# Patient Record
Sex: Female | Born: 1985 | Race: Black or African American | Hispanic: No | Marital: Single | Smoking: Never smoker
Health system: Southern US, Community
[De-identification: ages and names within clinical notes are randomized; demographics above are authoritative.]

---

## 2019-05-24 ENCOUNTER — Encounter: Payer: Self-pay | Admitting: Medical Oncology

## 2019-05-24 ENCOUNTER — Other Ambulatory Visit: Payer: Self-pay

## 2019-05-24 ENCOUNTER — Emergency Department
Admission: EM | Admit: 2019-05-24 | Discharge: 2019-05-24 | Disposition: A | Discharge status: Home / Self Care | Payer: BC Managed Care – PPO | Attending: Emergency Medicine | Admitting: Emergency Medicine

## 2019-05-24 DIAGNOSIS — N3000 Acute cystitis without hematuria: Secondary | ICD-10-CM | POA: Diagnosis not present

## 2019-05-24 DIAGNOSIS — R300 Dysuria: Secondary | ICD-10-CM | POA: Diagnosis present

## 2019-05-24 LAB — URINALYSIS, ROUTINE W REFLEX MICROSCOPIC
Bilirubin Urine: NEGATIVE
Glucose, UA: NEGATIVE mg/dL
Ketones, ur: NEGATIVE mg/dL
Nitrite: NEGATIVE
Protein, ur: 30 mg/dL — AB
Specific Gravity, Urine: 1.008 (ref 1.005–1.030)
pH: 7 (ref 5.0–8.0)

## 2019-05-24 LAB — WET PREP, GENITAL
Clue Cells Wet Prep HPF POC: NONE SEEN
Sperm: NONE SEEN
Trich, Wet Prep: NONE SEEN
WBC, Wet Prep HPF POC: NONE SEEN
Yeast Wet Prep HPF POC: NONE SEEN

## 2019-05-24 LAB — POCT PREGNANCY, URINE: Preg Test, Ur: NEGATIVE

## 2019-05-24 MED ORDER — SULFAMETHOXAZOLE-TRIMETHOPRIM 800-160 MG PO TABS: 1.0000 | ORAL_TABLET | Freq: Two times a day (BID) | ORAL | 0 refills | Status: DC

## 2019-05-24 NOTE — ED Notes (Signed)
Pelvic Cart set up at bedside. 

## 2019-05-24 NOTE — ED Triage Notes (Signed)
Pt reports she began over the weekend feeling like she has a UTI, has had one in the past and it feels similar.

## 2019-05-24 NOTE — ED Provider Notes (Signed)
[facility] Emergency Department Provider Note   ____________________________________________   None    (approximate)  I have reviewed the triage vital signs and the nursing notes.   HISTORY  Chief Complaint Urinary Tract Infection   HPI Olivia Lopez is a 33 y.o. female presents to the ED with complaint of UTI symptoms.  Patient states that this began over the weekend and feels similar to a UTI she has had in the past.  She denies any fever, chills, nausea or vomiting.  She also reports a vaginal discharge with a history of bacterial vaginosis.      History reviewed. No pertinent past medical history.  There are no active problems to display for this patient.   History reviewed. No pertinent surgical history.  Prior to Admission medications   Medication Sig Start Date End Date Taking? Authorizing Provider  sulfamethoxazole-trimethoprim (BACTRIM DS) 800-160 MG tablet Take 1 tablet by mouth 2 (two) times daily. 05/24/19   Tommi Rumps, PA-C    Allergies Patient has no known allergies.  No family history on file.  Social History Social History   Tobacco Use  . Smoking status: Not on file  Substance Use Topics  . Alcohol use: Not on file  . Drug use: Not on file    Review of Systems Constitutional: No fever/chills Eyes: No visual changes. Cardiovascular: Denies chest pain. Respiratory: Denies shortness of breath. Gastrointestinal: No abdominal pain.  No nausea, no vomiting.  Genitourinary: Positive for dysuria.  Positive for vaginal discharge. Musculoskeletal: Negative for back pain. Skin: Negative for rash. Neurological: Negative for headaches, focal weakness or numbness. ___________________________________________   PHYSICAL EXAM:  VITAL SIGNS: ED Triage Vitals  Enc Vitals Group     BP 05/24/19 0837 (!) 113/59     Pulse Rate 05/24/19 0837 (!) 105     Resp 05/24/19 0837 20     Temp 05/24/19 0837 98.5 F (36.9 C)     Temp Source 05/24/19 0837 Oral     SpO2 05/24/19 0837 100 %     Weight 05/24/19 0837 106 lb (48.1 kg)     Height 05/24/19 0837 5\' 4"  (1.626 m)     Head Circumference --      Peak Flow --      Pain Score 05/24/19 0842 8     Pain Loc --      Pain Edu? --      Excl. in GC? --    Constitutional: Alert and oriented. Well appearing and in no acute distress. Eyes: Conjunctivae are normal.  Head: Atraumatic. Neck: No stridor.   Cardiovascular: Normal rate, regular rhythm. Grossly normal heart sounds.  Good peripheral circulation. Respiratory: Normal respiratory effort.  No retractions. Lungs CTAB. Gastrointestinal: Soft and nontender. No distention.  No CVA tenderness. Genitourinary:  Cara Monforton PA-Student preformed the vaginal exam.  External exam was unremarkable.  No adnexal masses or tenderness noted.  No cervical motion tenderness.  Wet prep was obtained. Musculoskeletal: No lower extremity tenderness nor edema.  No joint effusions. Neurologic:  Normal speech and language. No gross focal neurologic deficits are appreciated. No gait instability. Skin:  Skin is warm, dry and intact. No rash noted. Psychiatric: Mood and affect are normal. Speech and behavior are normal.  ____________________________________________   LABS (all labs ordered are listed, but only abnormal results are displayed)  Labs Reviewed  URINALYSIS, ROUTINE W REFLEX MICROSCOPIC - Abnormal; Notable for the following components:      Result Value  Color, Urine YELLOW (*)    APPearance HAZY (*)    Hgb urine dipstick SMALL (*)    Protein, ur 30 (*)    Leukocytes,Ua TRACE (*)    Bacteria, UA RARE (*)    All other components within normal limits  WET PREP, GENITAL  POCT PREGNANCY, URINE   _________________________________________   PROCEDURES  Procedure(s) performed (including Critical Care):  Procedures   ____________________________________________   INITIAL IMPRESSION / ASSESSMENT AND PLAN / ED  COURSE  As part of my medical decision making, I reviewed the following data within the electronic MEDICAL RECORD NUMBER Notes from prior ED visits and [place] Controlled Substance Database  33 year old female presents to the ED with complaint of dysuria for the last 4 days.  Patient has a history of a pyelonephritis and states that the discomfort she is feeling is the early symptoms of what she has had in the past.  She relates some dysuria and frequency.  She denies any fever, chills, nausea or vomiting.  Wet prep was reassuring and patient was made aware.  She was given a prescription for Bactrim DS twice daily for 10 days and encouraged to follow-up with her PCP for recheck of her urine after she is finished the antibiotic.  She also was encouraged to drink lots of fluids and take Tylenol if needed.  ____________________________________________   FINAL CLINICAL IMPRESSION(S) / ED DIAGNOSES  Final diagnoses:  Acute cystitis without hematuria     ED Discharge Orders         Ordered    sulfamethoxazole-trimethoprim (BACTRIM DS) 800-160 MG tablet  2 times daily     05/24/19 1035           Note:  This document was prepared using Dragon voice recognition software and may include unintentional dictation errors.    Johnn Hai, PA-C 05/24/19 1311    Arta Silence, MD 05/24/19 [phone]

## 2019-05-24 NOTE — Discharge Instructions (Signed)
Follow-up with your primary care provider if any continued problems and also to have your urine checked after finishing the antibiotic.  Increase fluids.  You may also drink cranberry juice to help with some of the symptoms.  Take Tylenol if needed for pain.  The antibiotic was sent to your pharmacy at [facility] in [place].  Finish the entire 10-day course.

## 2020-03-02 ENCOUNTER — Other Ambulatory Visit: Payer: Self-pay

## 2020-03-02 ENCOUNTER — Emergency Department: Payer: BC Managed Care – PPO

## 2020-03-02 ENCOUNTER — Encounter: Payer: Self-pay | Admitting: Emergency Medicine

## 2020-03-02 ENCOUNTER — Emergency Department
Admission: EM | Admit: 2020-03-02 | Discharge: 2020-03-02 | Disposition: A | Discharge status: Home / Self Care | Payer: BC Managed Care – PPO | Attending: Emergency Medicine | Admitting: Emergency Medicine

## 2020-03-02 DIAGNOSIS — S63502A Unspecified sprain of left wrist, initial encounter: Secondary | ICD-10-CM | POA: Insufficient documentation

## 2020-03-02 DIAGNOSIS — Y999 Unspecified external cause status: Secondary | ICD-10-CM | POA: Diagnosis not present

## 2020-03-02 DIAGNOSIS — S6992XA Unspecified injury of left wrist, hand and finger(s), initial encounter: Secondary | ICD-10-CM | POA: Diagnosis present

## 2020-03-02 DIAGNOSIS — Y939 Activity, unspecified: Secondary | ICD-10-CM | POA: Diagnosis not present

## 2020-03-02 DIAGNOSIS — W010XXA Fall on same level from slipping, tripping and stumbling without subsequent striking against object, initial encounter: Secondary | ICD-10-CM | POA: Insufficient documentation

## 2020-03-02 DIAGNOSIS — Y929 Unspecified place or not applicable: Secondary | ICD-10-CM | POA: Diagnosis not present

## 2020-03-02 MED ORDER — DICLOFENAC SODIUM 50 MG PO TBEC: 50.0000 mg | DELAYED_RELEASE_TABLET | Freq: Two times a day (BID) | ORAL | 0 refills | Status: AC

## 2020-03-02 MED ORDER — NAPROXEN 500 MG PO TABS
500.0000 mg | ORAL_TABLET | Freq: Once | ORAL | Status: AC
  Administered 2020-03-02: 500 mg via ORAL
  Filled 2020-03-02: qty 1

## 2020-03-02 NOTE — ED Triage Notes (Signed)
Pt arrived via POV with reports of L wrist injury, fell over cat and tried to catch herself with left hand. Swelling noted.

## 2020-03-02 NOTE — ED Notes (Signed)
Pt is unable to sign for discharge; discharge instructions were reviewed and pt verbalized understanding with no questions.

## 2020-03-02 NOTE — ED Provider Notes (Signed)
[facility] Emergency Department Provider Note ____________________________________________  Time seen: 1616  I have reviewed the triage vital signs and the nursing notes.  HISTORY  Chief Complaint  Wrist Pain  HPI Olivia Lopez is a 34 y.o. female left-handed female who presents to the ED for evaluation of left wrist pain and swelling.  Patient describes mechanical fall, after  she tripped over her cat.  She describes landing on outstretched left wrist, and has pain and swelling to the dorsal lateral wrist at this time.  She denies any other injury at this time.  History reviewed. No pertinent past medical history.  There are no problems to display for this patient.  History reviewed. No pertinent surgical history.  Prior to Admission medications   Medication Sig Start Date End Date Taking? Authorizing Provider  diclofenac (VOLTAREN) 50 MG EC tablet Take 1 tablet (50 mg total) by mouth 2 (two) times daily. 03/02/20 04/01/20  Cyle Kenyon, Charlesetta Ivory, PA-C    Allergies Patient has no known allergies.  No family history on file.  Social History Social History   Tobacco Use  . Smoking status: Never Smoker  . Smokeless tobacco: Never Used  Vaping Use  . Vaping Use: Never assessed  Substance Use Topics  . Alcohol use: Not on file  . Drug use: Not on file    Review of Systems  Constitutional: Negative for fever. Cardiovascular: Negative for chest pain. Respiratory: Negative for shortness of breath. Musculoskeletal: Negative for back pain.  Left wrist pain as above. Skin: Negative for rash. Neurological: Negative for headaches, focal weakness or numbness. ____________________________________________  PHYSICAL EXAM:  VITAL SIGNS: ED Triage Vitals  Enc Vitals Group     BP 03/02/20 1441 108/74     Pulse Rate 03/02/20 1441 73     Resp 03/02/20 1441 16     Temp 03/02/20 1441 98.8 F (37.1 C)     Temp Source 03/02/20 1441 Oral     SpO2  03/02/20 1441 98 %     Weight 03/02/20 1441 110 lb (49.9 kg)     Height 03/02/20 1441 5' (1.524 m)     Head Circumference --      Peak Flow --      Pain Score 03/02/20 1451 8     Pain Loc --      Pain Edu? --      Excl. in GC? --     Constitutional: Alert and oriented. Well appearing and in no distress. Head: Normocephalic and atraumatic. Eyes: Conjunctivae are normal. Normal extraocular movements Cardiovascular: Normal rate, regular rhythm. Normal distal pulses. Respiratory: Normal respiratory effort.  Musculoskeletal: Normal composite fist on the left.  Patient with tenderness, swelling, and increased pain over the dorsal radial wrist.  There is subtle soft tissue swelling noted radial wrist.  Patient is able to extend the thumb without difficulty.  Patient is tender over the snuffbox but there is also soft tissue swelling in the same region.  Nontender with normal range of motion in all extremities.  Neurologic: Cranial nerves II through XII grossly intact.  Normal intrinsic testing noted.  Normal gross sensation is intact. Normal speech and language. No gross focal neurologic deficits are appreciated. Skin:  Skin is warm, dry and intact. No rash noted. ____________________________________________   RADIOLOGY  DG Left Wrist  Negative ____________________________________________  PROCEDURES  Abducted thumb spica splint. Naproxen 500 mg p.o.  Procedures ____________________________________________  INITIAL IMPRESSION / ASSESSMENT AND PLAN / ED COURSE  Patient  with ED evaluation of injury sustained following mechanical fall.  She presents with left dorsal radial wrist pain and swelling.  X-rays negative for any acute fracture or dislocation.  Patient will be placed in an abducted thumb spica splint for support.  She is advised to follow-up with Ortho hand specialist for ongoing evaluation.  A return to work note is provided for the patient with left hand use as  tolerated.  Olivia Lopez was evaluated in Emergency Department on 03/02/2020 for the symptoms described in the history of present illness. She was evaluated in the context of the global COVID-19 pandemic, which necessitated consideration that the patient might be at risk for infection with the SARS-CoV-2 virus that causes COVID-19. Institutional protocols and algorithms that pertain to the evaluation of patients at risk for COVID-19 are in a state of rapid change based on information released by regulatory bodies including the CDC and federal and state organizations. These policies and algorithms were followed during the patient's care in the ED. ____________________________________________  FINAL CLINICAL IMPRESSION(S) / ED DIAGNOSES  Final diagnoses:  Sprain of left wrist, initial encounter      Lissa Hoard, PA-C 03/03/20 0000    Dionne Bucy, MD 03/03/20 1459

## 2020-03-02 NOTE — Discharge Instructions (Signed)
You XR did not show a fracture, but your exam reveals a likely sprain to your wrist at the thumb. Wear the wrist split as directed. Apply ice packs to reduce swelling. Take the prescription meds as directed. Follow-up with your provider for continued symptoms. Return as needed.

## 2020-03-25 ENCOUNTER — Other Ambulatory Visit: Payer: BC Managed Care – PPO

## 2020-03-25 ENCOUNTER — Other Ambulatory Visit: Payer: Self-pay | Admitting: Critical Care Medicine

## 2020-03-25 DIAGNOSIS — Z20822 Contact with and (suspected) exposure to covid-19: Secondary | ICD-10-CM

## 2020-03-26 LAB — NOVEL CORONAVIRUS, NAA: SARS-CoV-2, NAA: DETECTED — AB

## 2020-03-26 LAB — SARS-COV-2, NAA 2 DAY TAT

## 2020-03-27 ENCOUNTER — Other Ambulatory Visit: Payer: Self-pay | Admitting: Family

## 2020-03-27 ENCOUNTER — Telehealth: Payer: Self-pay | Admitting: Family

## 2020-03-27 DIAGNOSIS — Z609 Problem related to social environment, unspecified: Secondary | ICD-10-CM

## 2020-03-27 DIAGNOSIS — U071 COVID-19: Secondary | ICD-10-CM

## 2020-03-27 NOTE — Telephone Encounter (Signed)
Called to Discuss with patient about Covid symptoms and the use of the monoclonal antibody infusion for those with mild to moderate Covid symptoms and at a high risk of hospitalization.     Pt appears to qualify for this infusion due to co-morbid conditions and/or a member of an at-risk group in accordance with the FDA Emergency Use Authorization.    Olivia Lopez tested positive for COVID on 03/25/20. Currently experiencing loss of smell and congestion. Symptoms started on 03/24/20. Qualifying risk factors include social vulnerability/high SVI score. Spoke with Olivia Lopez regarding the risk and benefits of treatment with Regeneron and she wishes to pursue treatment.  Hello Olivia Lopez,   You have been scheduled to receive Regeneron (the monoclonal antibody we discussed) on : 03/28/20 at 4:30pm  If you have been tested outside of a [facility] - you MUST bring a copy of your positive test with you the morning of your appointment. You may take a photo of this and upload to your MyChart portal or have the testing facility fax the result to [phone]    The address for the infusion clinic site is:  --GPS address is [address] - the parking is located near [employer] building where you will see  COVID19 Infusion feather banner marking the entrance to parking.   (see photos below)            --Enter into the 2nd entrance where the "wave, flag banner" is at the road. Turn into this 2nd entrance and immediately turn left to park in 1 of the 5 parking spots.   --Please stay in your car and call the desk for assistance inside [phone].   --Average time in department is roughly 2 hours for Regeneron treatment - this includes preparation of the medication, IV start and the required 1 hour monitoring after the infusion.    Should you develop worsening shortness of breath, chest pain or severe breathing problems please do not wait for this appointment and go to the Emergency  room for evaluation and treatment. You will undergo another oxygen screen before your infusion to ensure this is the best treatment option for you. There is a chance that the best decision may be to send you to the Emergency Room for evaluation at the time of your appointment.   The day of your visit you should: Marland Kitchen Get plenty of rest the night before and drink plenty of water . Eat a light meal/snack before coming and take your medications as prescribed  . Wear warm, comfortable clothes with a shirt that can roll-up over the elbow (will need IV start).  . Wear a mask  . Consider bringing some activity to help pass the time  Many commercial insurers are waiving bills related to COVID treatment however some have ranged from $300-640. We are starting to see some insurers send bills to patients later for the administration of the medication - we are learning more information but you may receive a bill after your appointment.  Please contact your insurance agent to discuss prior to your appointment if you would like further details about billing specific to your policy.    The CPT code is [phone] for your reference.    Marcos Eke, NP 03/27/2020 4:15 PM

## 2020-03-27 NOTE — Progress Notes (Signed)
I connected by phone with Olivia Lopez on 03/27/2020 at 4:17 PM to discuss the potential use of a new treatment for mild to moderate COVID-19 viral infection in non-hospitalized patients.  This patient is a 34 y.o. female that meets the FDA criteria for Emergency Use Authorization of COVID monoclonal antibody casirivimab/imdevimab.  Has a (+) direct SARS-CoV-2 viral test result  Has mild or moderate COVID-19   Is NOT hospitalized due to COVID-19  Is within 10 days of symptom onset  Has at least one of the high risk factor(s) for progression to severe COVID-19 and/or hospitalization as defined in EUA.  Specific high risk criteria : Other high risk medical condition per CDC:  Social vulnerability / high SVI   I have spoken and communicated the following to the patient or parent/caregiver regarding COVID monoclonal antibody treatment:  1. FDA has authorized the emergency use for the treatment of mild to moderate COVID-19 in adults and pediatric patients with positive results of direct SARS-CoV-2 viral testing who are 73 years of age and older weighing at least 40 kg, and who are at high risk for progressing to severe COVID-19 and/or hospitalization.  2. The significant known and potential risks and benefits of COVID monoclonal antibody, and the extent to which such potential risks and benefits are unknown.  3. Information on available alternative treatments and the risks and benefits of those alternatives, including clinical trials.  4. Patients treated with COVID monoclonal antibody should continue to self-isolate and use infection control measures (e.g., wear mask, isolate, social distance, avoid sharing personal items, clean and disinfect "high touch" surfaces, and frequent handwashing) according to CDC guidelines.   5. The patient or parent/caregiver has the option to accept or refuse COVID monoclonal antibody treatment.  After reviewing this information with the patient, The patient  agreed to proceed with receiving casirivimab\imdevimab infusion and will be provided a copy of the Fact sheet prior to receiving the infusion.   Jeanine Luz, NP 03/27/2020 4:17 PM

## 2020-03-28 ENCOUNTER — Ambulatory Visit (HOSPITAL_COMMUNITY): Payer: BC Managed Care – PPO

## 2020-03-29 ENCOUNTER — Ambulatory Visit (HOSPITAL_COMMUNITY): Payer: BC Managed Care – PPO

## 2020-03-30 ENCOUNTER — Ambulatory Visit (HOSPITAL_COMMUNITY)
Admission: RE | Admit: 2020-03-30 | Discharge: 2020-03-30 | Disposition: A | Discharge status: Home / Self Care | Payer: BC Managed Care – PPO | Source: Ambulatory Visit | Attending: Pulmonary Disease | Admitting: Pulmonary Disease

## 2020-03-30 DIAGNOSIS — Z609 Problem related to social environment, unspecified: Secondary | ICD-10-CM | POA: Insufficient documentation

## 2020-03-30 DIAGNOSIS — U071 COVID-19: Secondary | ICD-10-CM | POA: Insufficient documentation

## 2020-03-30 MED ORDER — SODIUM CHLORIDE 0.9 % IV SOLN: INTRAVENOUS | Status: DC | PRN

## 2020-03-30 MED ORDER — FAMOTIDINE IN NACL 20-0.9 MG/50ML-% IV SOLN: 20.0000 mg | Freq: Once | INTRAVENOUS | Status: DC | PRN

## 2020-03-30 MED ORDER — METHYLPREDNISOLONE SODIUM SUCC 125 MG IJ SOLR: 125.0000 mg | Freq: Once | INTRAMUSCULAR | Status: DC | PRN

## 2020-03-30 MED ORDER — SODIUM CHLORIDE 0.9 % IV SOLN: Freq: Once | INTRAVENOUS | Status: AC

## 2020-03-30 MED ORDER — DIPHENHYDRAMINE HCL 50 MG/ML IJ SOLN: 50.0000 mg | Freq: Once | INTRAMUSCULAR | Status: DC | PRN

## 2020-03-30 MED ORDER — EPINEPHRINE 0.3 MG/0.3ML IJ SOAJ: 0.3000 mg | Freq: Once | INTRAMUSCULAR | Status: DC | PRN

## 2020-03-30 MED ORDER — SODIUM CHLORIDE 0.9 % IV SOLN
1200.0000 mg | Freq: Once | INTRAVENOUS | Status: AC
  Administered 2020-03-30: 1200 mg via INTRAVENOUS

## 2020-03-30 MED ORDER — ALBUTEROL SULFATE HFA 108 (90 BASE) MCG/ACT IN AERS: 2.0000 | INHALATION_SPRAY | Freq: Once | RESPIRATORY_TRACT | Status: DC | PRN

## 2020-03-30 NOTE — Progress Notes (Addendum)
  Diagnosis: COVID-19  Physician: Dr. Shan Levans  Procedure: Covid Infusion Clinic Med: casirivimab\imdevimab infusion - Provided patient with casirivimab\imdevimab fact sheet for patients, parents and caregivers prior to infusion.  Complications: Pt hypotensive post infusion. Orders received for 1L NS bolus. WCTM patient. Bolus completed and VS are WNL, BP 121/65. Pt discharged home.   Discharge: [facility] 03/30/2020

## 2020-03-30 NOTE — Discharge Instructions (Signed)

## 2020-06-17 ENCOUNTER — Ambulatory Visit (LOCAL_COMMUNITY_HEALTH_CENTER): Payer: Self-pay | Admitting: Physician Assistant

## 2020-06-17 ENCOUNTER — Encounter: Payer: Self-pay | Admitting: Physician Assistant

## 2020-06-17 ENCOUNTER — Ambulatory Visit: Payer: BC Managed Care – PPO

## 2020-06-17 ENCOUNTER — Other Ambulatory Visit: Payer: Self-pay

## 2020-06-17 VITALS — BP 110/51 | Ht 61.0 in | Wt 109.0 lb

## 2020-06-17 DIAGNOSIS — Z113 Encounter for screening for infections with a predominantly sexual mode of transmission: Secondary | ICD-10-CM

## 2020-06-17 DIAGNOSIS — Z0000 Encounter for general adult medical examination without abnormal findings: Secondary | ICD-10-CM

## 2020-06-17 DIAGNOSIS — Z3009 Encounter for other general counseling and advice on contraception: Secondary | ICD-10-CM

## 2020-06-17 DIAGNOSIS — Z30432 Encounter for removal of intrauterine contraceptive device: Secondary | ICD-10-CM

## 2020-06-17 NOTE — Progress Notes (Signed)
[facility] DEPARTMENT [facility] [address] Main Number: [phone]    Family Planning Visit- Initial Visit  Subjective:  Olivia Lopez is a 34 y.o.  [id number]   being seen today for an initial well woman visit and to discuss family planning options.  She is currently using IUD Mirena for pregnancy prevention. Patient reports she does want a pregnancy in the next year.  Patient has the following medical conditions does not have a problem list on file.  Chief Complaint  Patient presents with  . Contraception    Physical and IUD removal    Patient reports that she has done well with the IUD and has not been having periods regularly.  States that she had her last pap in 08/2018.  Per chart review for care everywhere, unable to find documentation, so will have patient sign ROI.  Will do CBE today.  Patient desires IUD removal to achieve pregnancy.   Patient denies any concerns today.    Body mass index is 20.6 kg/m. - Patient is eligible for diabetes screening based on BMI and age >96?  not applicable HA1C ordered? not applicable  Patient reports 1  partner in last year. Desires STI screening?  Yes  Has patient been screened once for HCV in the past?  No  No results found for: HCVAB  Does the patient have current drug use (including MJ), have a partner with drug use, and/or has been incarcerated since last result? No  If yes-- Screen for HCV through [facility]   Does the patient meet criteria for HBV testing? No  Criteria:  -Household, sexual or needle sharing contact with HBV -History of drug use -HIV positive -Those with known Hep C   Health Maintenance Due  Topic Date Due  . Hepatitis C Screening  Never done  . COVID-19 Vaccine (1) Never done  . HIV Screening  Never done  . TETANUS/TDAP  Never done  . INFLUENZA VACCINE  02/04/2020    Review of Systems  All other systems reviewed and are negative.   The following  portions of the patient's history were reviewed and updated as appropriate: allergies, current medications, past family history, past medical history, past social history, past surgical history and problem list. Problem list updated.   See flowsheet for other program required questions.  Objective:   Vitals:   06/17/20 1406  BP: (!) 110/51  Weight: 109 lb (49.4 kg)  Height: 5\' 1"  (1.549 m)    Physical Exam Vitals and nursing note reviewed.  Constitutional:      General: She is not in acute distress.    Appearance: Normal appearance.  HENT:     Head: Normocephalic and atraumatic.     Mouth/Throat:     Mouth: Mucous membranes are moist.     Pharynx: Oropharynx is clear. No oropharyngeal exudate or posterior oropharyngeal erythema.  Eyes:     Conjunctiva/sclera: Conjunctivae normal.  Neck:     Thyroid: No thyroid mass, thyromegaly or thyroid tenderness.  Cardiovascular:     Rate and Rhythm: Normal rate and regular rhythm.  Pulmonary:     Effort: Pulmonary effort is normal.  Chest:  Breasts:     Right: Normal. No mass, nipple discharge, skin change, tenderness, axillary adenopathy or supraclavicular adenopathy.     Left: Normal. No mass, nipple discharge, skin change, tenderness, axillary adenopathy or supraclavicular adenopathy.    Abdominal:     Palpations: Abdomen is soft. There is  no mass.     Tenderness: There is no abdominal tenderness. There is no guarding or rebound.  Genitourinary:    General: Normal vulva.     Rectum: Normal.     Comments: External genitalia/pubic area without nits, lice, edema, erythema, lesions and inguinal adenopathy. Vagina with normal mucosa and discharge. Cervix without visible lesions.  IUD strings not visualized. Uterus firm, mobile, nt, no masses, no CMT, no adnexal tenderness or fullness. Musculoskeletal:     Cervical back: Neck supple. No tenderness.  Lymphadenopathy:     Cervical: No cervical adenopathy.     Upper Body:     Right  upper body: No supraclavicular, axillary or pectoral adenopathy.     Left upper body: No supraclavicular, axillary or pectoral adenopathy.  Skin:    General: Skin is warm and dry.     Findings: No bruising, erythema, lesion or rash.  Neurological:     Mental Status: She is alert and oriented to person, place, and time.  Psychiatric:        Mood and Affect: Mood normal.        Behavior: Behavior normal.        Thought Content: Thought content normal.        Judgment: Judgment normal.       Assessment and Plan:  Olivia Lopez is a 34 y.o. female presenting to the [facility] Department for an initial well woman exam/family planning visit  Contraception counseling: Reviewed all forms of birth control options in the tiered based approach. available including abstinence; over the counter/barrier methods; hormonal contraceptive medication including pill, patch, ring, injection,contraceptive implant, ECP; hormonal and nonhormonal IUDs; permanent sterilization options including vasectomy and the various tubal sterilization modalities. Risks, benefits, and typical effectiveness rates were reviewed.  Questions were answered.  Written information was also given to the patient to review.  Patient desires IUD removal to achieve pregnancy. She will follow up in  1 year and prn for surveillance.  She was told to call with any further questions, or with any concerns about this method of contraception.  Emphasized use of condoms 100% of the time for STI prevention.  Patient was not a candidate for ECP today.  1. Encounter for counseling regarding contraception Reviewed with patient as above re: BCMs. Enc to use condoms with all sex if changes mind about pregnancy soon and advised that she can RTC for other hormonal method as well.  2. Screening for STD (sexually transmitted disease) Await test results.  Counseled that RN will call if needs to RTC for treatment once results are back.  -  Chlamydia/Gonorrhea [place] [facility] - HIV/HCV [place] [facility] - Syphilis Serology, [facility]  3. Well woman exam (no gynecological exam) Reviewed with patient healthy habits to maintain general health. Reviewed with patient healthy habits for self and partner for healthy pregnancy and baby. Enc to limit caffeine, avoid EtOH, tobacco, other drugs and teratogens. Enc MVI or PNV 1 po daily. Enc to establish with/ follow up with PCP for primary care concerns, age appropriate screenings and illness. ROI sent to previous provider for pap record.  4. Encounter for IUD removal   IUD Removal  Patient identified, informed consent performed, consent signed.  Patient was in the dorsal lithotomy position, normal external genitalia was noted.  A speculum was placed in the patient's vagina, normal discharge was noted, no lesions. The cervix was visualized, no lesions, no abnormal discharge.   The strings of the IUD  were not visualized, cytobrush was attempted which was unsuccessful so Kelly forceps were introduced into the endometrial cavity and the IUD was grasped and removed in its entirety.  Patient tolerated the procedure well.    Patient plans for pregnancy soon and she was told to avoid teratogens, take PNV and folic acid.  Routine preventative health maintenance measures emphasized.      No follow-ups on file.  No future appointments.  Matt Holmes, PA

## 2020-06-17 NOTE — Progress Notes (Signed)
Pt is here for physical and IUD removal. Pt desires pregnancy. Pt reports has Mirena IUD that was placed at [facility] that was placed 12/2015 per pt. Pt's last sex without a condom was 06/15/2020. Counseled pt that sperm can live in the body for up to 7 days and as soon as IUD is removed she could potentially become pregnant from her last sex. Pt states understanding and still desires IUD removal today. RN counseling for IUD removal completed. Pt desires STI screening and blood work today.

## 2020-06-17 NOTE — Progress Notes (Signed)
ROI for [facility] for last pap obtained and reviewed and signed by pt. ROI faxed per Sadie Haber, PA order and fax confirmation received. Counseled pt per provider orders and pt states understanding. Provider orders completed.

## 2020-06-21 LAB — HM HIV SCREENING LAB: HM HIV Screening: NEGATIVE

## 2020-06-21 LAB — HM HEPATITIS C SCREENING LAB: HM Hepatitis Screen: NEGATIVE

## 2020-06-24 ENCOUNTER — Emergency Department
Admission: EM | Admit: 2020-06-24 | Discharge: 2020-06-24 | Disposition: A | Discharge status: Home / Self Care | Payer: Self-pay | Attending: Emergency Medicine | Admitting: Emergency Medicine

## 2020-06-24 ENCOUNTER — Encounter: Payer: Self-pay | Admitting: Emergency Medicine

## 2020-06-24 ENCOUNTER — Other Ambulatory Visit: Payer: Self-pay

## 2020-06-24 ENCOUNTER — Emergency Department: Payer: Self-pay

## 2020-06-24 DIAGNOSIS — M545 Low back pain, unspecified: Secondary | ICD-10-CM | POA: Insufficient documentation

## 2020-06-24 LAB — URINALYSIS, COMPLETE (UACMP) WITH MICROSCOPIC
Bilirubin Urine: NEGATIVE
Glucose, UA: NEGATIVE mg/dL
Hgb urine dipstick: NEGATIVE
Ketones, ur: NEGATIVE mg/dL
Leukocytes,Ua: NEGATIVE
Nitrite: NEGATIVE
Protein, ur: NEGATIVE mg/dL
Specific Gravity, Urine: 1.015 (ref 1.005–1.030)
pH: 6 (ref 5.0–8.0)

## 2020-06-24 LAB — POC URINE PREG, ED: Preg Test, Ur: NEGATIVE

## 2020-06-24 MED ORDER — NAPROXEN 500 MG PO TABS: 500.0000 mg | ORAL_TABLET | Freq: Two times a day (BID) | ORAL | 0 refills | Status: AC

## 2020-06-24 MED ORDER — KETOROLAC TROMETHAMINE 30 MG/ML IJ SOLN
30.0000 mg | Freq: Once | INTRAMUSCULAR | Status: AC
  Administered 2020-06-24: 30 mg via INTRAMUSCULAR
  Filled 2020-06-24: qty 1

## 2020-06-24 NOTE — ED Triage Notes (Signed)
Pt to ED via POV with c/o lower back pain, pt states recently started working at the [employer] and ever since then "it's just been terrible".

## 2020-06-24 NOTE — ED Provider Notes (Signed)
[facility] Emergency Department Provider Note   ____________________________________________   Event Date/Time   First MD Initiated Contact with Patient 06/24/20 [phone]     (approximate)  I have reviewed the triage vital signs and the nursing notes.   HISTORY  Chief Complaint Back Pain   HPI Olivia Lopez is a 34 y.o. female presents to the ED with complaint of low back pain that started after she began working at the [employer] and has continued to hurt.  Patient reports that she has not had an injury and no previous problems with her back.  She denies any urinary symptoms.  She has been taking Tylenol without any relief.  She rates her pain as an 8 out of 10.       History reviewed. No pertinent past medical history.  There are no problems to display for this patient.   Past Surgical History:  Procedure Laterality Date   CESAREAN SECTION      Prior to Admission medications   Medication Sig Start Date End Date Taking? Authorizing Provider  naproxen (NAPROSYN) 500 MG tablet Take 1 tablet (500 mg total) by mouth 2 (two) times daily with a meal. 06/24/20   Tommi Rumps, PA-C    Allergies Patient has no known allergies.  History reviewed. No pertinent family history.  Social History Social History   Tobacco Use   Smoking status: Never Smoker   Smokeless tobacco: Never Used  Substance Use Topics   Alcohol use: Never   Drug use: Never    Review of Systems Constitutional: No fever/chills Eyes: No visual changes. Cardiovascular: Denies chest pain. Respiratory: Denies shortness of breath. Gastrointestinal: No abdominal pain.  No nausea, no vomiting.  No diarrhea.  No constipation. Genitourinary: Negative for dysuria. Musculoskeletal: Positive for low back pain. Skin: Negative for rash. Neurological: Negative for headaches, focal weakness or numbness. ____________________________________________   PHYSICAL EXAM:  VITAL  SIGNS: ED Triage Vitals  Enc Vitals Group     BP 06/24/20 0835 (!) 97/46     Pulse Rate 06/24/20 0835 77     Resp 06/24/20 0835 20     Temp 06/24/20 0835 98 F (36.7 C)     Temp Source 06/24/20 0835 Oral     SpO2 06/24/20 0835 100 %     Weight 06/24/20 0833 109 lb (49.4 kg)     Height 06/24/20 0833 5\' 1"  (1.549 m)     Head Circumference --      Peak Flow --      Pain Score 06/24/20 0833 8     Pain Loc --      Pain Edu? --      Excl. in GC? --     Constitutional: Alert and oriented. Well appearing and in no acute distress. Eyes: Conjunctivae are normal.  Head: Atraumatic. Neck: No stridor.   Cardiovascular: Normal rate, regular rhythm. Grossly normal heart sounds.  Good peripheral circulation. Respiratory: Normal respiratory effort.  No retractions. Lungs CTAB. Musculoskeletal: Mild generalized tenderness is noted at L5-S1 and paravertebral muscles bilaterally.  Patient has no difficulty with range of motion is able to get from a sitting position to standing without any assistance.  Good muscle strength bilaterally.  Normal gait was noted.  No point tenderness is noted on palpation of the thoracic spine. Neurologic:  Normal speech and language.  Reflexes were 2+ bilaterally.  No gross focal neurologic deficits are appreciated.  Skin:  Skin is warm, dry and intact. No  rash noted. Psychiatric: Mood and affect are normal. Speech and behavior are normal.  ____________________________________________   LABS (all labs ordered are listed, but only abnormal results are displayed)  Labs Reviewed  URINALYSIS, COMPLETE (UACMP) WITH MICROSCOPIC - Abnormal; Notable for the following components:      Result Value   Color, Urine YELLOW (*)    APPearance CLOUDY (*)    Bacteria, UA MANY (*)    All other components within normal limits  POC URINE PREG, ED    RADIOLOGY I, Tommi Rumps, personally viewed and evaluated these images (plain radiographs) as part of my medical decision  making, as well as reviewing the written report by the radiologist.   Official radiology report(s): DG Lumbar Spine 2-3 Views  Result Date: 06/24/2020 CLINICAL DATA:  Pain for 1 week.  No known injury. EXAM: LUMBAR SPINE - 2-3 VIEW COMPARISON:  No prior. FINDINGS: Paraspinal soft tissues are unremarkable. Pelvic calcifications consistent phleboliths. No acute or focal bony abnormality identified. No evidence of fracture. Normal bony alignment. IMPRESSION: No acute or focal abnormality. Electronically Signed   By: Maisie Fus  Register   On: 06/24/2020 11:39    ____________________________________________   PROCEDURES  Procedure(s) performed (including Critical Care):  Procedures   ____________________________________________   INITIAL IMPRESSION / ASSESSMENT AND PLAN / ED COURSE  As part of my medical decision making, I reviewed the following data within the electronic MEDICAL RECORD NUMBER Notes from prior ED visits and [place] Controlled Substance Database  34 year old female presents to the ED with complaint of low back pain that started after she began working at the [employer].  She denies any specific injury and has been taking Tylenol without any relief.  Patient denies any urinary symptoms and physical exam is unremarkable with the exception of some muscle skeletal tenderness on palpation of her lower lumbar and sacral area.  Patient was given Toradol 30 mg IM while in the ED and x-rays were negative for any acute bony abnormality.  Patient states that after getting the injection she was feeling much better.  A prescription for naproxen was sent to her pharmacy.  She is continue taking this twice a day with food.  She is to follow-up with her PCP or urgent care if any continued problems.  ____________________________________________   FINAL CLINICAL IMPRESSION(S) / ED DIAGNOSES  Final diagnoses:  Acute bilateral low back pain without sciatica     ED Discharge Orders         Ordered     naproxen (NAPROSYN) 500 MG tablet  2 times daily with meals        06/24/20 1150          *Please note:  ROONEY GLADWIN was evaluated in Emergency Department on 06/24/2020 for the symptoms described in the history of present illness. She was evaluated in the context of the global COVID-19 pandemic, which necessitated consideration that the patient might be at risk for infection with the SARS-CoV-2 virus that causes COVID-19. Institutional protocols and algorithms that pertain to the evaluation of patients at risk for COVID-19 are in a state of rapid change based on information released by regulatory bodies including the CDC and federal and state organizations. These policies and algorithms were followed during the patient's care in the ED.  Some ED evaluations and interventions may be delayed as a result of limited staffing during and the pandemic.*   Note:  This document was prepared using Dragon voice recognition software and may include unintentional dictation  errors.    Tommi Rumps, PA-C 06/24/20 1349    Chesley Noon, MD 06/25/20 [phone]

## 2020-06-24 NOTE — Discharge Instructions (Signed)
Follow-up with your primary care provider or [facility] acute care if any continued problems with your back.  Use ice or heat to your lower back as needed for discomfort.  Begin taking naproxen 500 mg twice daily with food.

## 2020-11-20 ENCOUNTER — Ambulatory Visit: Payer: Self-pay

## 2020-11-20 ENCOUNTER — Other Ambulatory Visit: Payer: Self-pay

## 2020-11-20 DIAGNOSIS — Z20822 Contact with and (suspected) exposure to covid-19: Secondary | ICD-10-CM

## 2020-11-20 LAB — POC COVID19 BINAXNOW: SARS Coronavirus 2 Ag: NEGATIVE

## 2020-12-26 ENCOUNTER — Other Ambulatory Visit: Payer: Self-pay

## 2020-12-26 ENCOUNTER — Ambulatory Visit: Payer: Self-pay | Admitting: Family Medicine

## 2020-12-26 ENCOUNTER — Encounter: Payer: Self-pay | Admitting: Family Medicine

## 2020-12-26 DIAGNOSIS — Z113 Encounter for screening for infections with a predominantly sexual mode of transmission: Secondary | ICD-10-CM

## 2020-12-26 LAB — WET PREP FOR TRICH, YEAST, CLUE
Trichomonas Exam: NEGATIVE
Yeast Exam: NEGATIVE

## 2020-12-26 NOTE — Progress Notes (Signed)
Pt here for STD screening.  Wet mount results reviewed with Provider.  No treatment required.  Pt given condoms. Berdie Ogren, RN

## 2020-12-26 NOTE — Progress Notes (Signed)
[facility] Department STI clinic/screening visit  Subjective:  Olivia Lopez is a 35 y.o. female being seen today for an STI screening visit. The patient reports they do not have symptoms.  Patient reports that they do desire a pregnancy in the next year.   They reported they are not interested in discussing contraception today.  Patient's last menstrual period was 12/26/2020.   Patient has the following medical conditions:  There are no problems to display for this patient.   Chief Complaint  Patient presents with   SEXUALLY TRANSMITTED DISEASE    screening    HPI  Patient reports her for screening, denies s/sx   Last HIV test per patient/review of record was12/2021 Patient reports last pap was 08/16/2018   See flowsheet for further details and programmatic requirements.    The following portions of the patient's history were reviewed and updated as appropriate: allergies, current medications, past medical history, past social history, past surgical history and problem list.  Objective:  There were no vitals filed for this visit.  Physical Exam Vitals and nursing note reviewed.  Constitutional:      Appearance: Normal appearance.  HENT:     Head: Normocephalic and atraumatic.     Mouth/Throat:     Mouth: Mucous membranes are moist.     Pharynx: Oropharynx is clear. No oropharyngeal exudate or posterior oropharyngeal erythema.  Pulmonary:     Effort: Pulmonary effort is normal.  Chest:  Breasts:    Right: No axillary adenopathy or supraclavicular adenopathy.     Left: No axillary adenopathy or supraclavicular adenopathy.  Abdominal:     General: Abdomen is flat.     Palpations: There is no mass.     Tenderness: There is no abdominal tenderness. There is no rebound.  Genitourinary:    General: Normal vulva.     Exam position: Lithotomy position.     Pubic Area: No rash or pubic lice.      Labia:        Right: No rash or lesion.        Left: No  rash or lesion.      Vagina: Normal. No vaginal discharge, erythema, bleeding or lesions.     Cervix: No cervical motion tenderness, discharge, friability, lesion or erythema.     Uterus: Normal.      Adnexa: Right adnexa normal and left adnexa normal.     Rectum: Normal.  Lymphadenopathy:     Head:     Right side of head: No preauricular or posterior auricular adenopathy.     Left side of head: No preauricular or posterior auricular adenopathy.     Cervical: No cervical adenopathy.     Upper Body:     Right upper body: No supraclavicular or axillary adenopathy.     Left upper body: No supraclavicular or axillary adenopathy.     Lower Body: No right inguinal adenopathy. No left inguinal adenopathy.  Skin:    General: Skin is warm and dry.     Findings: No rash.  Neurological:     Mental Status: She is alert and oriented to person, place, and time.     Assessment and Plan:  Olivia Lopez is a 35 y.o. female presenting to the [facility] Department for STI screening  1. Screening examination for venereal disease Patient accepted all screenings including wet prep, oral, vaginal CT/GC and  declined bloodwork for HIV/RPR.  Patient meets criteria for HepB screening? Yes. Ordered? No -  declined  Patient meets criteria for HepC screening? No. Ordered? No - declined   Wet prep results neg   NO Treatment needed  Discussed time line for State Lab results and that patient will be called with positive results and encouraged patient to call if she had not heard in 2 weeks.  Counseled to return or seek care for continued or worsening symptoms Recommended condom use with all sex  Patient is currently using  No BCM   to prevent pregnancy. Patient desires pregnancy.   Encouraged patient to start taking PNV.    - Chlamydia/Gonorrhea [place] [facility] - Gonococcus culture - WET PREP FOR TRICH, YEAST, CLUE     No follow-ups on file.  No future appointments.  Wendi Snipes,  FNP

## 2020-12-31 ENCOUNTER — Telehealth: Payer: Self-pay | Admitting: Family Medicine

## 2020-12-31 NOTE — Telephone Encounter (Signed)
Phone call to pt. Pt confirmed password from last visit. Pt counseled that we do not have any new results in, only the wet mount results, can take up to 3 weeks to get them all back. TR appt scheduled for 01/16/21.

## 2020-12-31 NOTE — Telephone Encounter (Signed)
Patient wants to discuss the results of her STI visit, please call her back.

## 2021-01-01 LAB — GONOCOCCUS CULTURE

## 2021-01-16 ENCOUNTER — Other Ambulatory Visit: Payer: Self-pay

## 2021-05-11 IMAGING — CR DG WRIST COMPLETE 3+V*L*
4 series · 4 of 4 positions shown · non-contrast
Comparison: None.

CLINICAL DATA: Pt arrived via POV with reports of L wrist injury,
fell over cat and tried to catch herself with left hand. Swelling
noted. Pt denies prior injury or surgery.

EXAM:
LEFT WRIST - COMPLETE 3+ VIEW

[wrist pa]
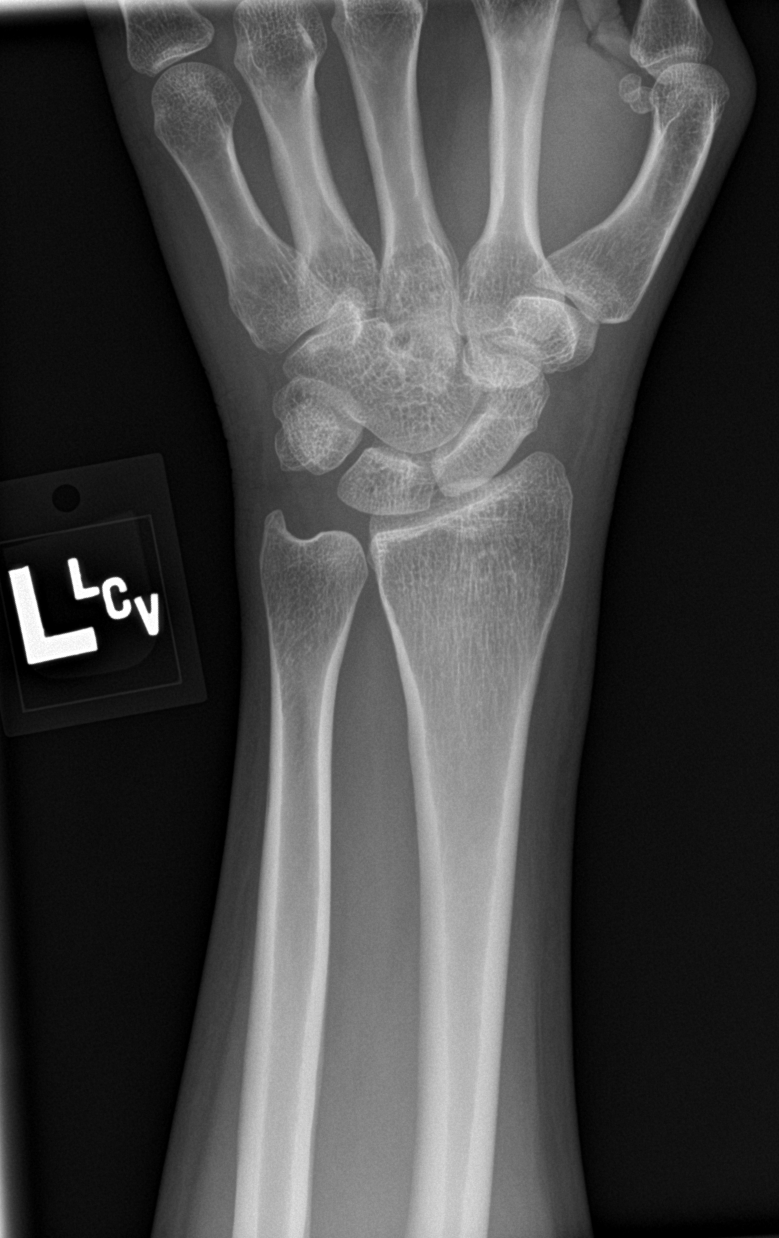

[wrist obl]
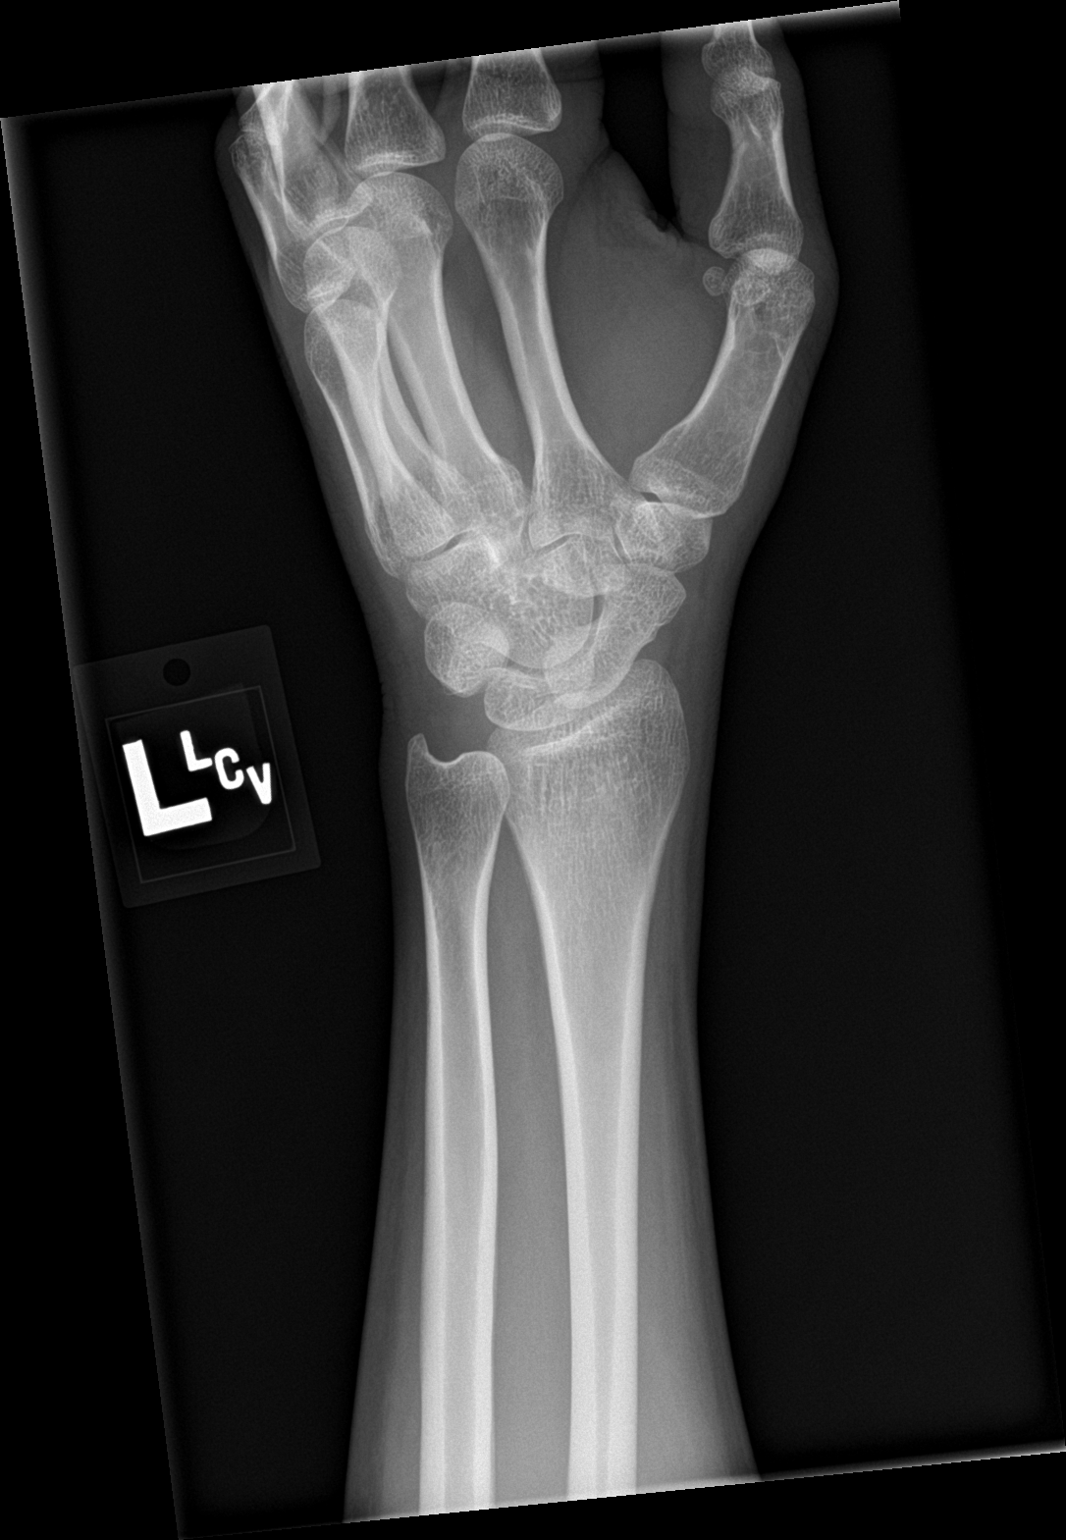

[wrist lat]
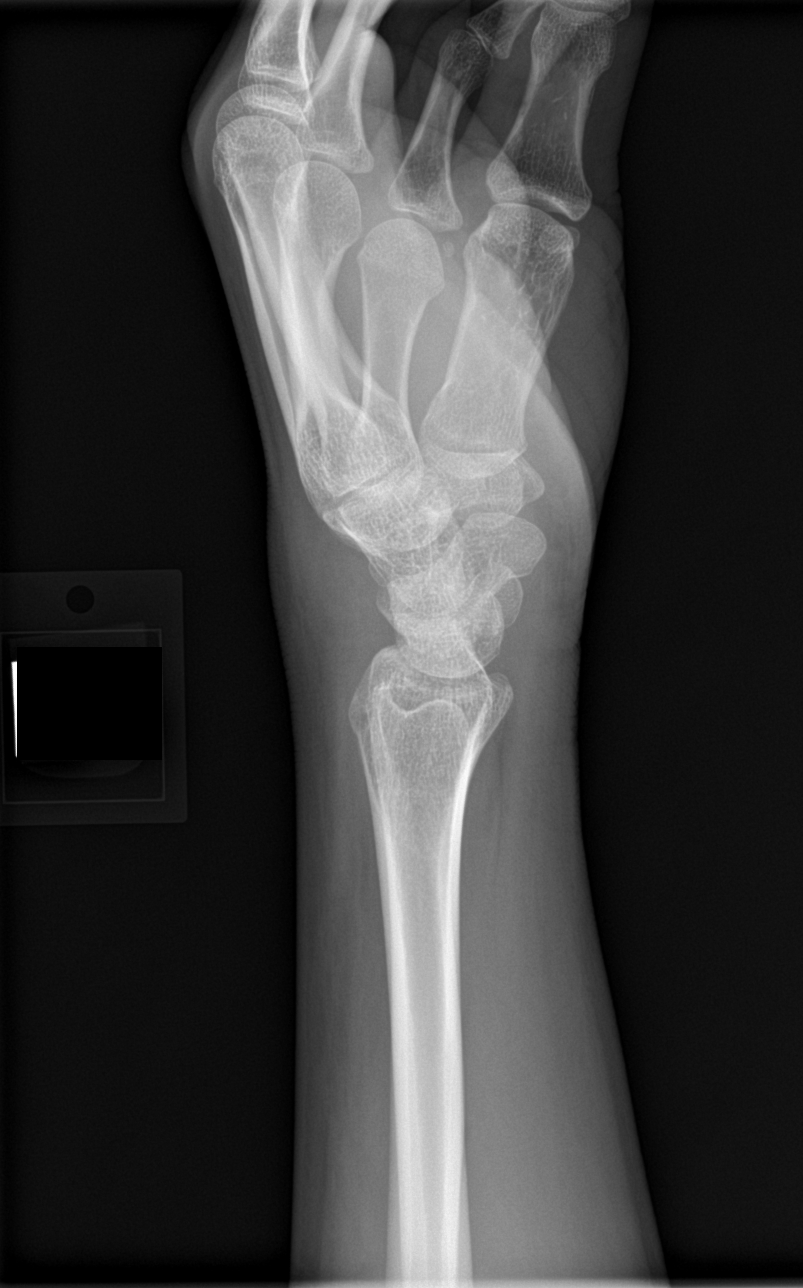

[navicular]
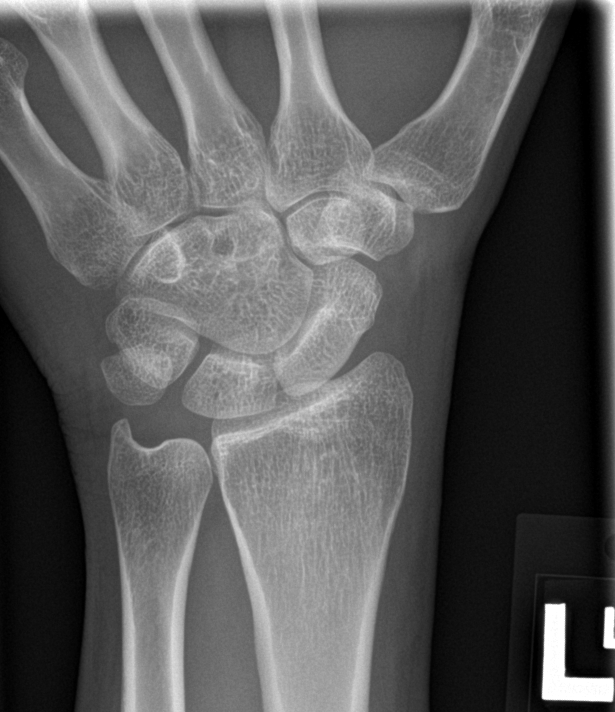

[4 of 4 positions shown; findings below may reference images not displayed]

FINDINGS: There is no evidence of fracture or dislocation. There is no
evidence of arthropathy or other focal bone abnormality. Soft
tissues are unremarkable.
IMPRESSION: Negative.
